# Patient Record
Sex: Female | Born: 1955 | Race: White | Hispanic: No | Smoking: Current every day smoker
Health system: Southern US, Community
[De-identification: ages and names within clinical notes are randomized; demographics above are authoritative.]

## PROBLEM LIST (undated history)

## (undated) HISTORY — PX: SHOULDER SURGERY: SHX246

---

## 1997-06-29 ENCOUNTER — Emergency Department (HOSPITAL_COMMUNITY): Admission: EM | Admit: 1997-06-29 | Discharge: 1997-06-29 | Payer: Self-pay | Admitting: *Deleted

## 1997-11-10 ENCOUNTER — Emergency Department (HOSPITAL_COMMUNITY): Admission: EM | Admit: 1997-11-10 | Discharge: 1997-11-10 | Payer: Self-pay | Admitting: Emergency Medicine

## 1999-06-20 ENCOUNTER — Emergency Department (HOSPITAL_COMMUNITY): Admission: EM | Admit: 1999-06-20 | Discharge: 1999-06-20 | Payer: Self-pay | Admitting: Emergency Medicine

## 1999-06-23 ENCOUNTER — Ambulatory Visit (HOSPITAL_COMMUNITY): Admission: RE | Admit: 1999-06-23 | Discharge: 1999-06-23 | Payer: Self-pay | Admitting: Emergency Medicine

## 1999-06-23 ENCOUNTER — Encounter: Payer: Self-pay | Admitting: Emergency Medicine

## 1999-08-10 ENCOUNTER — Emergency Department (HOSPITAL_COMMUNITY): Admission: EM | Admit: 1999-08-10 | Discharge: 1999-08-10 | Payer: Self-pay | Admitting: Emergency Medicine

## 1999-08-10 ENCOUNTER — Encounter: Payer: Self-pay | Admitting: Emergency Medicine

## 2000-05-06 ENCOUNTER — Encounter: Payer: Self-pay | Admitting: Orthopedic Surgery

## 2000-05-06 ENCOUNTER — Ambulatory Visit (HOSPITAL_COMMUNITY): Admission: RE | Admit: 2000-05-06 | Discharge: 2000-05-06 | Payer: Self-pay | Admitting: Orthopedic Surgery

## 2000-06-23 ENCOUNTER — Encounter: Payer: Self-pay | Admitting: Neurosurgery

## 2000-06-27 ENCOUNTER — Inpatient Hospital Stay (HOSPITAL_COMMUNITY): Admission: RE | Admit: 2000-06-27 | Discharge: 2000-06-28 | Payer: Self-pay | Admitting: Neurosurgery

## 2000-06-27 ENCOUNTER — Encounter: Payer: Self-pay | Admitting: Neurosurgery

## 2000-07-22 ENCOUNTER — Encounter: Payer: Self-pay | Admitting: Neurosurgery

## 2000-07-22 ENCOUNTER — Encounter: Admission: RE | Admit: 2000-07-22 | Discharge: 2000-07-22 | Payer: Self-pay | Admitting: Neurosurgery

## 2000-09-20 ENCOUNTER — Encounter: Payer: Self-pay | Admitting: Neurosurgery

## 2000-09-20 ENCOUNTER — Encounter: Admission: RE | Admit: 2000-09-20 | Discharge: 2000-09-20 | Payer: Self-pay | Admitting: Neurosurgery

## 2008-03-13 ENCOUNTER — Emergency Department (HOSPITAL_COMMUNITY): Admission: EM | Admit: 2008-03-13 | Discharge: 2008-03-14 | Payer: Self-pay | Admitting: Emergency Medicine

## 2010-08-07 NOTE — Consult Note (Signed)
[facility]  Patient:    Kimberly Holloway, Kimberly Holloway                      MRN: [id number] Proc. Date: 08/10/99 Attending:  Angelia Mould. Derrell Lolling, M.D. CC:         Gloriajean Dell. Andrey Campanile, M.D., [place] F.P.             Rema Fendt, P.A., [place] F.P.                          Consultation Report  CHIEF COMPLAINT:  Lower abdominal pain.  HISTORY OF PRESENTILLNESS:  This is a 55 year old white female from [place], [place].  She was sent to me by Rema Fendt, at [facility] for evaluation of lower abdominal pain.  This patient gives a six months history of intermittent attacks of periumbilical and infraumbilical crampy abdominal pain and possible distention.  There has been no change in her bowel movements.  She has solid bowel movements about every other dayand she has no alteration in her bowel habits during these attacks.  During the resolution phase of this pain, she will note the right lower quadrant pain would be noticeable.  These episodes of pain occur every 4-6 weeks and last about 24 hours and then with complete resolution and no symptoms in-between.  She induces vomiting occasionally. She notices diminished appetite during attacks.  States that it hurts to eat.  She was seen in the [facility] Emergency Room on June 20, 1999, for similar symptoms.  Lab work was completely normal.  Abdominal ultrasound and pelvic ultrasound were completely normal.  She had a typical attack which beganat 10 a.m. on Sunday, Aug 09, 1999.  She induced vomiting once to see ifthat would help, but it did not.  She had a normal bowel movement yesterday.  She has had no voiding symptoms.  There is a question of whether she has had some fever, but there has been shaking chills. She is evaluated in the [facility] Emergency Room.  PAST HISTORY: 1. Right shoulder reconstruction. 2. Bronchitis and asthma. 3. Tobacco abuse. 4. No history of gastrointestinal  disease.  CURRENT MEDICATIONS: 1.Goody Powders p.r.n. 2. Benadryl p.r.n.  DRUG ALLERGIES:  None known.  SOCIAL HISTORY:  The patient is married.  She has two children.  She works for [employer].  Her husband works as a Designer, fashion/clothing.  She smokes two packs of cigarettes per day.  Denies the use of alcohol.  FAMILY HISTORY:  Mother 14 and living.  Has hypertension, non-insulin-dependent diabetes, hypothyroidism, and COPD.  Father is 79 and living.  Has arthritis and a history of Graves disease and a history of peptic ulcerdisease.  She has four sisters, one with Graves disease and one with lung cancer and one with hypothyroidism.  One brother living and well.  REVIEW OF SYSTEMS:  All systems are reviewed and are negative, except as described above.  PHYSICAL EXAMINATION  GENERAL:  Reasonably healthy-appearing white female in minimal distress.  VITAL SIGNS:  Blood pressure 86/58, respiratory rate 16, temperature 98.8, heart rate 89.  SKIN:  Warm and dry.  HEENT:  Sclerae clear.  Extraocularmovements intact.  Oropharynx reveals upper dentures and poor dentition lower.  NECK:  Supple, nontender.  No mass.  No thyromegaly.  No adenopathy. No bruit.  LUNGS:  Clear to auscultation.  No CVA tenderness.  CARDIAC:  Heart regular rate and rhythm.  No murmur.  ABDOMEN:  Not distended.  The abdomen is soft.  Bowel sounds are present.  She is tender in the right lower quadrant but there is no mass.  There is mild guarding and mild rebound which is inconsistent.  There is no hernia.  RECTAL EXAM:  No mass.  No tenderness.  Hemoccult negative.  EXTREMITIES:  Free range of motion.  No deformity.  No edema.  Good pulses.  NEUROLOGIC:  Grossly within normal limits.  ADMISSION DATA:  Urinalysis clear.  White blood cell count 2800. Hemoglobin 14.7.  Amylase 40.  Potassium 2.8.  Glucose 155.  SGPT 41.  IMPRESSION: 1. Chronic intermittent lower abdominal pain, etiology unclear.    Chronic  recurring, so that appendicitis is possible, but felt to be    atypical; inflammatory bowel disease is possible.  Intermittent small-bowel    obstruction is possible.  Adnexal disease is possible but felt to be    unlikely considering her normal ultrasound on June 21, 1999. 2. Hypokalemia, unknownetiology. 3. Tobacco abuse.  PLAN:  The patient will have IV fluids started and potassium will be replaced. Will proceed with CT scan of the abdomen now for screening for inflammatory or obstructive process. DD:  08/10/99 TD:  08/10/99 Job: [id number]

## 2010-08-07 NOTE — H&P (Signed)
[place]. [facility]  Patient:    Kimberly Holloway, Kimberly Holloway                       MRN: [id number] Adm. Date:  [id number] Attending:  Barton Fanny                         History and Physical  HISTORY OF PRESENT ILLNESS:  The patient is a 55 year old right-handed white female who has been having difficulties for the past 2-1/2 months with numbness in her hands and digits and jumpiness in her lower extremities.  She had pain in the back of her neck for a couple months but it has been more severe for the past month.  She is unaware of any weakness and overall her symptoms are somewhat worse on the right than the left side.  She has been treated with NSAIDs without relief.  The patient was found on examination to have evidence of hyperreflexia and clonus, consistent with myelopathy.  Her MRI of the cervical spine showed degenerative disk disease with superimposed disk herniations, C5-6 worse than C6-7.  At the C5-6 level, there was significant stenosis of the spinal canal and increased signal from within the spinal cord and the patient is admitted now for surgical decompression.  PAST MEDICAL HISTORY:  She does not describe any history of hypertension, myocardial infarction, cancer, stroke, diabetes, peptic ulcer disease, or lung disease.  PAST SURGICAL HISTORY:  Her only previous surgery was four surgeries on her right shoulder.  ALLERGIES:  She denies allergies to medications.  CURRENT MEDICATIONS:  She currently is not taking any medications.  FAMILY HISTORY:  Her mother is in fair health at age 23.  She has diabetes. Her father is in good health at age 5.  SOCIAL HISTORY:  The patient is separated.  She has children ages 26 and 64. She smokes a pack a day.  She has been smoking for 30 years.  She does not drink alcoholic beverages.  She denies history of substance abuse.  REVIEW OF SYSTEMS:  Notable for those difficulties described in the history  of present illness and past medical history but is otherwise unremarkable.  PHYSICAL EXAMINATION:  GENERAL:  The patient is a well-developed, well-nourished female in no acute distress.  VITAL SIGNS:  Temperature 97.3, pulse 80, blood pressure 120/60, respiratory rate 16, height 5 feet 5-1/2 inches, weight 137 pounds.  LUNGS:  Clear to auscultation.  She has symmetrical respiratory excursion.  HEART:  Regular rate and rhythm.  Normal S1, S2.  There is no murmur.  ABDOMEN:  Soft, nondistended.  Bowel sounds are present.  EXTREMITIES:  No clubbing, cyanosis, or edema.  MUSCULOSKELETAL:  Fairly good range of motion in the neck without significant discomfort on range of motion of the neck.  There is no exquisite tenderness to palpation over the neck.  NEUROLOGIC:  Weakness in the right triceps at 4-/5.  The remainder of the upper extremity strength is intact including 5/5 strength to the deltoids and biceps bilaterally, the left triceps, as well as the intrinsics and grip bilaterally.  Sensation is intake to pinprick throughout the upper and lower extremities.  Reflexes at the left biceps and brachioradialis are 2-3, at the right biceps and brachioradialis are 1-2.  Triceps are 3 bilaterally.  The quadriceps are 3-4 bilaterally.  The gastrocnemii have nonsustained clonus bilaterally and are 4 bilaterally.  She has Hoffmanns bilaterally but toes  are downgoing bilaterally.  She has normal gait and stance.  IMPRESSION:  Neck pain and cervical radiculopathy with right triceps weakness, but most significantly she has cervical myelopathy with hyperreflexia, clonus, and increased signal from within the spinal cord due to degenerative disk disease, disk herniation, spondylosis of the spinal cord, compression with stenosis, worse at C5-6, and to a lesser extent at C6-7.  There are more mild degenerative changes seen at C4-5.  PLAN:  The patient will be admitted for a C5-6 to C6-7 anterior  cervical diskectomy and arthrodesis with allograft and cervical plating.  She understands that there are degenerative changes seen at the C4-5 level and that these may well progress as time goes on but that the most significant compression seen at this time is at the C5-6 and C6-7 levels and surgery at those levels will most likely result in successful relief of her symptoms.  We discussed the nature of surgery to include the surgery, hospital stay and overall recuperation and the need for postoperative immobilization and a soft cervical collar and risks of surgery including the infection, bleeding, possible need for transfusion, the risk of nervous dysfunction with pain, weakness, numbness or paresthesias, risk of spinal cord dysfunction, paralysis of all four limbs and quadriplegia, the risk of failure of the arthrodesis particularly in light of her heavy smoking history and anesthetic risk for myocardial infarction, stroke, pneumonia, and death; all of which again are increased due to her significant smoking history.  Understanding all this, the patient does wish to proceed with surgery and is admitted for such. DD:  06/27/00 TD:  06/27/00 Job: [id number]

## 2010-08-07 NOTE — Op Note (Signed)
[place]. [facility]  Patient:    Kimberly Holloway, Kimberly Holloway                       MRN: [id number] Proc. Date: 06/27/00 Adm. Date:  [id number] Attending:  Barton Fanny                           Operative Report  PREOPERATIVE DIAGNOSES:  Cervical degenerative disk disease, herniated nucleus pulposus, and spondylosis, with myelopathy.  POSTOPERATIVE DIAGNOSES:   Cervical degenerative disk disease, herniated nucleus pulposus, and spondylosis, with myelopathy.  PROCEDURES:  C5-6 and C6-7 anterior cervical diskectomy and arthrodesis with iliac crest allograft and Tether cervical plating.  SURGEON:  Hewitt Shorts, M.D.  ASSISTANT:  Tanya Nones. Jeral Fruit, M.D.  ANESTHESIA:  General endotracheal.  INDICATION:  The patient is a 55 year old woman who presented with neck pain, radiculopathy, right triceps weakness, and hyperreflexia and clonus consistent with myelopathy, who was found to have degenerative disk disease with superimposed disk herniations and spondylosis at the C5-6 and C6-7 levels. There is increased signal within the spinal cord at the C5-6 level.  Decision was made to proceed with two-level anterior cervical diskectomy and fusion.  DESCRIPTION OF PROCEDURE:  Patient brought to the operating room and placed under general endotracheal anesthesia.  The patient was placed in 10 pounds of Holter traction.  The neck was prepped with Betadine soap and solution and draped in a sterile fashion.  A horizontal incision was made in the left side of the neck.  The line of the incision was infiltrated with local anesthetic with epinephrine.  The incision itself was made with a Shaw scalpel at a temperature of 120.  The incision was carried down through the subcutaneous tissue and platysma.  Dissection was then carried out through an avascular plane, leaving the sternocleidomastoid, carotid artery, and jugular vein laterally, and the trachea and esophagus  medially.  The ventral aspect of the vertebral column was identified and a localizing x-ray taken and the C5-6 and C6-7 intervertebral disk spaces identified.  Diskectomy was begun at each level with incision of the annulus and continued with microcurettes and pituitary rongeurs.  The cartilaginous end plates at C5 and 6 and C6 and 7 were removed using microcurettes and the Micro-Max drill.  The microscope was draped and brought into the field to provide additional magnification, illumination, and visualization, and the remainder of the procedure was performed using microdissection.  Posterior osteophytic overgrowth at each level was removed using the Micro-Max drill and the 2 mm Kerrison punch with thin footplate.  The posterior longitudinal ligament was thickened and partially calcified, and this was carefully removed, as were additional disk fragments herniated within the ligament tissue.  Foraminotomy was performed with removal of hypertrophic spurring from the uncinate processes, and good decompression of the thecal sac and nerve roots was achieved bilaterally.  Once all the degenerated disk material and ligament tissue was removed and good decompression achieved, hemostasis was established with the use of Gelfoam soaked in thrombin.  We then selected wedges of iliac crest allograft that were cut and shaped and positioned in the intervertebral disk space and countersunk.  Once both grafts were in place, the traction was discontinued.  We then selected a 35 mm Tether cervical plate.  It was positioned over the fusion construct and secured to the C5 and C7 vertebrae with a pair of 13 mm screws, and a single  13 mm screw was placed at C6.  Each of the screw holes was started with an awl and then the screws placed, and final tightening was done once all five screws were in place.  An x-ray was taken to confirm the fusion constructs alignment.  Good position was noted in the grafts and of  the plate and screws, and the wound was irrigated extensively with bacitracin solution, checked for hemostasis, which was established and confirmed, and then we proceeded with closure.  The platysma was closed with interrupted inverted 2-0 undyed Vicryl sutures, the subcutaneous and subcuticular were closed with interrupted inverted 3-0 undyed Vicryl sutures, and the skin edges were approximated with Dermabond.  The patient tolerated the procedure well.  The estimated blood loss was 150 cc. Sponge and needle count were correct.  Following surgery, the patient was reversed from the anesthetic, placed in a soft cervical collar, extubated, and transferred to the recovery room for further care. DD:  06/27/00 TD:  06/27/00 Job: [id number]

## 2010-12-24 LAB — POCT I-STAT, CHEM 8
Chloride: 103 mEq/L (ref 96–112)
Creatinine, Ser: 0.7 mg/dL (ref 0.4–1.2)
Glucose, Bld: 114 mg/dL — ABNORMAL HIGH (ref 70–99)
Potassium: 3.6 mEq/L (ref 3.5–5.1)

## 2010-12-24 LAB — URINALYSIS, ROUTINE W REFLEX MICROSCOPIC
Bilirubin Urine: NEGATIVE
Hgb urine dipstick: NEGATIVE
Nitrite: NEGATIVE
Specific Gravity, Urine: 1.015 (ref 1.005–1.030)
pH: 8.5 — ABNORMAL HIGH (ref 5.0–8.0)

## 2010-12-24 LAB — DIFFERENTIAL
Basophils Absolute: 0 10*3/uL (ref 0.0–0.1)
Lymphocytes Relative: 22 % (ref 12–46)
Neutro Abs: 4.4 10*3/uL (ref 1.7–7.7)
Neutrophils Relative %: 73 % (ref 43–77)

## 2010-12-24 LAB — CBC
Hemoglobin: 16.5 g/dL — ABNORMAL HIGH (ref 12.0–15.0)
Platelets: 189 10*3/uL (ref 150–400)
RDW: 13.3 % (ref 11.5–15.5)

## 2010-12-24 LAB — HEPATIC FUNCTION PANEL
ALT: 12 U/L (ref 0–35)
AST: 29 U/L (ref 0–37)
Albumin: 4.3 g/dL (ref 3.5–5.2)
Total Protein: 7.9 g/dL (ref 6.0–8.3)

## 2011-11-08 ENCOUNTER — Emergency Department (HOSPITAL_COMMUNITY)
Admission: EM | Admit: 2011-11-08 | Discharge: 2011-11-09 | Disposition: A | Discharge status: Home / Self Care | Payer: Self-pay | Attending: Emergency Medicine | Admitting: Emergency Medicine

## 2011-11-08 ENCOUNTER — Emergency Department (HOSPITAL_COMMUNITY): Payer: Self-pay

## 2011-11-08 ENCOUNTER — Encounter (HOSPITAL_COMMUNITY): Payer: Self-pay | Admitting: *Deleted

## 2011-11-08 DIAGNOSIS — M25512 Pain in left shoulder: Secondary | ICD-10-CM

## 2011-11-08 DIAGNOSIS — F17200 Nicotine dependence, unspecified, uncomplicated: Secondary | ICD-10-CM | POA: Insufficient documentation

## 2011-11-08 DIAGNOSIS — M25519 Pain in unspecified shoulder: Secondary | ICD-10-CM | POA: Insufficient documentation

## 2011-11-08 NOTE — ED Notes (Signed)
Pt c/o left shoulder/arm pain; hurts to raise arm to shoulder level; no known injury

## 2011-11-09 MED ORDER — HYDROCODONE-ACETAMINOPHEN 5-325 MG PO TABS: 2.0000 | ORAL_TABLET | Freq: Four times a day (QID) | ORAL | Status: AC | PRN

## 2011-11-09 NOTE — ED Provider Notes (Signed)
History     CSN: [id number]  Arrival date & time 11/08/11  2120   First MD Initiated Contact with Patient 11/09/11 0017      Chief Complaint  Patient presents with  . Arm Pain    (Consider location/radiation/quality/duration/timing/severity/associated sxs/prior treatment) HPI Comments: Patient presents today with a chief complaint of left shoulder pain.  No known injury.  Decreased ROM and pain with abduction of the shoulder.  Patient is a 56 y.o. female presenting with shoulder pain. The history is provided by the patient.  Shoulder Pain This is a new problem. Episode onset: three weeks. The problem occurs constantly. The problem has been gradually worsening. Exacerbated by: abduction. She has tried NSAIDs for the symptoms. The treatment provided mild relief.    History reviewed. No pertinent past medical history.  Past Surgical History  Procedure Date  . Shoulder surgery     No family history on file.  History  Substance Use Topics  . Smoking status: Current Everyday Smoker -- 1.0 packs/day  . Smokeless tobacco: Not on file  . Alcohol Use: No    OB History    Grav Para Term Preterm Abortions TAB SAB Ect Mult Living                  Review of Systems  Musculoskeletal:       Left shoulder pain  Skin: Negative for color change.    Allergies  Morphine and related  Home Medications   Current Outpatient Rx  Name Route Sig Dispense Refill  . GOODY HEADACHE PO Oral Take 1 packet by mouth daily as needed. Arm pain.      BP 124/74  Pulse 88  Temp 98 F (36.7 C) (Oral)  Wt 137 lb (62.143 kg)  SpO2 99%  Physical Exam  Nursing note and vitals reviewed. Constitutional: She appears well-developed and well-nourished. No distress.  HENT:  Head: Normocephalic and atraumatic.  Cardiovascular: Normal rate, regular rhythm and normal heart sounds.   Pulmonary/Chest: Effort normal and breath sounds normal.  Musculoskeletal:       Left shoulder: She exhibits  decreased range of motion. She exhibits no tenderness, no bony tenderness, no swelling, no deformity and normal pulse.       Decreased ROM and pain with abduction of the left shoulder  Neurological: She is alert.  Skin: Skin is warm and dry. She is not diaphoretic. No erythema.  Psychiatric: She has a normal mood and affect.    ED Course  Procedures (including critical care time)  Labs Reviewed - No data to display Dg Shoulder Left  11/08/2011  *RADIOLOGY REPORT*  Clinical Data: Left shoulder pain for 3 weeks.  No injury.  LEFT SHOULDER - 2+ VIEW  Comparison: None.  Findings: Postoperative changes in the cervical spine.  The left shoulder appears intact. No evidence of acute fracture or subluxation.  No focal bone lesions.  Bone matrix and cortex appear intact.  No abnormal radiopaque densities in the soft tissues. Focal irregularity in the anterior left second rib may represent old fracture deformity.  Coracoclavicular and acromioclavicular spaces appear intact.  IMPRESSION: No acute bony abnormalities appreciated.   Original Report Authenticated By: Marlon Pel, M.D.      No diagnosis found.    MDM  Patient presenting with left shoulder pain.  Decreased abduction of left shoulder.  No acute injury.  Suspect rotator cuff.  Patient given short course of pain medications and referral to Orthopedics.  Pascal Lux Saxton, PA-C 11/10/11 2239

## 2011-11-11 NOTE — ED Provider Notes (Signed)
Medical screening examination/treatment/procedure(s) were performed by non-physician practitioner and as supervising physician I was immediately available for consultation/collaboration.  Eddison Searls M Jiah Bari, MD 11/11/11 1628 

## 2014-06-26 ENCOUNTER — Emergency Department (HOSPITAL_COMMUNITY)
Admission: EM | Admit: 2014-06-26 | Discharge: 2014-06-27 | Disposition: A | Discharge status: Home / Self Care | Payer: Worker's Compensation | Attending: Emergency Medicine | Admitting: Emergency Medicine

## 2014-06-26 ENCOUNTER — Encounter (HOSPITAL_COMMUNITY): Payer: Self-pay | Admitting: *Deleted

## 2014-06-26 DIAGNOSIS — S50852A Superficial foreign body of left forearm, initial encounter: Secondary | ICD-10-CM | POA: Insufficient documentation

## 2014-06-26 DIAGNOSIS — Y9389 Activity, other specified: Secondary | ICD-10-CM | POA: Diagnosis not present

## 2014-06-26 DIAGNOSIS — Z720 Tobacco use: Secondary | ICD-10-CM | POA: Insufficient documentation

## 2014-06-26 DIAGNOSIS — Y9289 Other specified places as the place of occurrence of the external cause: Secondary | ICD-10-CM | POA: Diagnosis not present

## 2014-06-26 DIAGNOSIS — Y990 Civilian activity done for income or pay: Secondary | ICD-10-CM | POA: Insufficient documentation

## 2014-06-26 DIAGNOSIS — Y288XXA Contact with other sharp object, undetermined intent, initial encounter: Secondary | ICD-10-CM | POA: Diagnosis not present

## 2014-06-26 NOTE — ED Notes (Signed)
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  No pain

## 2014-06-27 ENCOUNTER — Emergency Department (HOSPITAL_COMMUNITY): Payer: Worker's Compensation

## 2014-06-27 MED ORDER — LIDOCAINE HCL 2 % IJ SOLN
5.0000 mL | Freq: Once | INTRAMUSCULAR | Status: AC
  Administered 2014-06-27: 100 mg
  Filled 2014-06-27: qty 20

## 2014-06-27 MED ORDER — CEPHALEXIN 500 MG PO CAPS: 500.0000 mg | ORAL_CAPSULE | Freq: Four times a day (QID) | ORAL | Status: DC

## 2014-06-27 MED ORDER — CEPHALEXIN 250 MG PO CAPS
500.0000 mg | ORAL_CAPSULE | Freq: Once | ORAL | Status: AC
  Administered 2014-06-27: 500 mg via ORAL
  Filled 2014-06-27: qty 2

## 2014-06-27 NOTE — ED Notes (Signed)
MD Glick at bedside. 

## 2014-06-27 NOTE — ED Notes (Signed)
MD Preston FleetingGlick at bedside,

## 2014-06-27 NOTE — ED Provider Notes (Signed)
CSN: [id number]     Arrival date & time 06/26/14  2328 History   First MD Initiated Contact with Patient 06/27/14 0108     This chart was scribed for Kimberly Booze, MD by Arlan Organ, ED Scribe. This patient was seen in room A01C/A01C and the patient's care was started 1:16 AM.   Chief Complaint  Patient presents with  . Foreign Body in Skin   The history is provided by the patient. No language interpreter was used.    HPI Comments: Kimberly Holloway is a 59 y.o. female who presents to the Emergency Department here after accidentally lodging a knitting hook in her L forearm while at work this evening. She denies any pain at this time. Ms. Carrara denies any attempts to remove the object herself prior to arrival. Pt unaware of Tetanus status. Pt with known allergies to Morphine.  History reviewed. No pertinent past medical history. Past Surgical History  Procedure Laterality Date  . Shoulder surgery     No family history on file. History  Substance Use Topics  . Smoking status: Current Every Day Smoker -- 1.00 packs/day  . Smokeless tobacco: Not on file  . Alcohol Use: Yes   OB History    No data available     Review of Systems  Musculoskeletal: Negative for arthralgias.  Skin: Positive for wound.  All other systems reviewed and are negative.     Allergies  Morphine and related  Home Medications   Prior to Admission medications   Medication Sig Start Date End Date Taking? Authorizing Provider  Aspirin-Acetaminophen-Caffeine (GOODY HEADACHE PO) Take 1 packet by mouth daily as needed. Arm pain.    Historical Provider, MD   Triage Vitals: BP 132/78 mmHg  Pulse 92  Temp(Src) 98.3 F (36.8 C) (Oral)  Resp 16  SpO2 97%   Physical Exam  Constitutional: She is oriented to person, place, and time. She appears well-developed and well-nourished. No distress.  HENT:  Head: Normocephalic and atraumatic.  Eyes: EOM are normal. Pupils are equal, round, and reactive to light.   Neck: Normal range of motion. Neck supple. No JVD present.  Cardiovascular: Normal rate, regular rhythm and normal heart sounds.   No murmur heard. Pulmonary/Chest: Effort normal and breath sounds normal. She has no wheezes. She has no rales. She exhibits no tenderness.  Abdominal: Soft. Bowel sounds are normal. She exhibits no distension and no mass. There is no tenderness.  Musculoskeletal: Normal range of motion. She exhibits no edema.  Lymphadenopathy:    She has no cervical adenopathy.  Neurological: She is alert and oriented to person, place, and time. No cranial nerve deficit. Coordination normal.  Skin: Skin is warm and dry. No rash noted.  Foreign body protruding from L forearm  Psychiatric: She has a normal mood and affect. Judgment and thought content normal.  Nursing note and vitals reviewed.   ED Course  FOREIGN BODY REMOVAL Date/Time: 06/27/2014 2:00 AM Performed by: Kimberly Holloway Authorized by: Preston Fleeting, Anasia Agro Consent: Verbal consent obtained. Written consent not obtained. Risks and benefits: risks, benefits and alternatives were discussed Consent given by: patient Patient understanding: patient states understanding of the procedure being performed Patient consent: the patient's understanding of the procedure matches consent given Procedure consent: procedure consent matches procedure scheduled Relevant documents: relevant documents present and verified Test results: test results available and properly labeled Site marked: the operative site was marked Imaging studies: imaging studies available Required items: required blood products, implants, devices, and special equipment  available Patient identity confirmed: verbally with patient and arm band Time out: Immediately prior to procedure a "time out" was called to verify the correct patient, procedure, equipment, support staff and site/side marked as required. Body area: skin General location: upper extremity Location  details: left forearm Anesthesia: local infiltration Local anesthetic: lidocaine 2% without epinephrine Anesthetic total: 2 ml Patient sedated: no Patient restrained: no Patient cooperative: yes Localization method: visualized Removal mechanism: manipulation, small skin incision with #11 scalpel. Dressing: dressing applied Tendon involvement: none Depth: subcutaneous Complexity: simple 1 objects recovered. Objects recovered: hook Post-procedure assessment: foreign body removed Patient tolerance: Patient tolerated the procedure well with no immediate complications   (including critical care time)  DIAGNOSTIC STUDIES: Oxygen Saturation is 97% on RA, Normal by my interpretation.    COORDINATION OF CARE: 1:09 AM-Discussed treatment plan with pt at bedside and pt agreed to plan.     Imaging Review Dg Forearm Left  06/27/2014   CLINICAL DATA:  Foreign body in the scan. Metallic pin/hook punctured through the left medial forearm.  EXAM: LEFT FOREARM - 2 VIEW  COMPARISON:  None.  FINDINGS: A metallic instrument is demonstrated projected over the mid to proximal left forearm along the dorsal surface superficial to the bone. No bone involvement is demonstrated. No evidence of acute fracture or dislocation. No significant soft tissue gas collection.  IMPRESSION: Metallic foreign body demonstrated in the soft tissues over the dorsal aspect of the proximal left forearm. No acute bony abnormalities.   Electronically Signed   By: Burman NievesWilliam  Stevens M.D.   On: 06/27/2014 00:08   Images viewed by me.   MDM   Final diagnoses:  Foreign body in left forearm, initial encounter    Foreign body in left forearm. X-rays confirm metallic foreign body. It appears to be in subcutaneous tissue. After local anesthesia, it was removed with gentle traction. Hook got caught near the skin and a small incision was made in the skin and the bevel of an 18-gauge needle was placed over the& metallic body was removed in  its entirety.  TDaP booster is given. She is discharged with prescription for cephalexin for 3 days.  I personally performed the services described in this documentation, which was scribed in my presence. The recorded information has been reviewed and is accurate.      Kimberly Boozeavid Zaylie Gisler, MD 06/27/14 [phone]

## 2014-06-27 NOTE — Discharge Instructions (Signed)
Take ibuprofen or acetaminophen as needed for pain.  Watch for signs of infection.  Td Vaccine (Tetanus and Diphtheria): What You Need to Know 1. Why get vaccinated? Tetanus  and diphtheria are very serious diseases. They are rare in the [place] today, but people who do become infected often have severe complications. Td vaccine is used to protect adolescents and adults from both of these diseases. Both tetanus and diphtheria are infections caused by bacteria. Diphtheria spreads from person to person through coughing or sneezing. Tetanus-causing bacteria enter the body through cuts, scratches, or wounds. TETANUS (Lockjaw) causes painful muscle tightening and stiffness, usually all over the body.  It can lead to tightening of muscles in the head and neck so you can't open your mouth, swallow, or sometimes even breathe. Tetanus kills about 1 out of every 5 people who are infected. DIPHTHERIA can cause a thick coating to form in the back of the throat.  It can lead to breathing problems, paralysis, heart failure, and death. Before vaccines, the [place] saw as many as 200,000 cases a year of diphtheria and hundreds of cases of tetanus. Since vaccination began, cases of both diseases have dropped by about 99%. 2. Td vaccine Td vaccine can protect adolescents and adults from tetanus and diphtheria. Td is usually given as a booster dose every 10 years but it can also be given earlier after a severe and dirty wound or burn. Your doctor can give you more information. Td may safely be given at the same time as other vaccines. 3. Some people should not get this vaccine  If you ever had a life-threatening allergic reaction after a dose of any tetanus or diphtheria containing vaccine, OR if you have a severe allergy to any part of this vaccine, you should not get Td. Tell your doctor if you have any severe allergies.  Talk to your doctor if you:  have epilepsy or another nervous system  problem,  had severe pain or swelling after any vaccine containing diphtheria or tetanus,  ever had Guillain Barr Syndrome (GBS),  aren't feeling well on the day the shot is scheduled. 4. Risks of a vaccine reaction With a vaccine, like any medicine, there is a chance of side effects. These are usually mild and go away on their own. Serious side effects are also possible, but are very rare. Most people who get Td vaccine do not have any problems with it. Mild Problems  following Td (Did not interfere with activities)  Pain where the shot was given (about 8 people in 10)  Redness or swelling where the shot was given (about 1 person in 3)  Mild fever (about 1 person in 15)  Headache or Tiredness (uncommon) Moderate Problems following Td (Interfered with activities, but did not require medical attention)  Fever over 102F (rare) Severe Problems  following Td (Unable to perform usual activities; required medical attention)  Swelling, severe pain, bleeding and/or redness in the arm where the shot was given (rare). Problems that could happen after any vaccine:  Brief fainting spells can happen after any medical procedure, including vaccination. Sitting or lying down for about 15 minutes can help prevent fainting, and injuries caused by a fall. Tell your doctor if you feel dizzy, or have vision changes or ringing in the ears.  Severe shoulder pain and reduced range of motion in the arm where a shot was given can happen, very rarely, after a vaccination.  Severe allergic reactions from a vaccine are very  rare, estimated at less than 1 in a million doses. If one were to occur, it would usually be within a few minutes to a few hours after the vaccination. 5. What if there is a serious reaction? What should I look for?  Look for anything that concerns you, such as signs of a severe allergic reaction, very high fever, or behavior changes. Signs of a severe allergic reaction can include  hives, swelling of the face and throat, difficulty breathing, a fast heartbeat, dizziness, and weakness. These would usually start a few minutes to a few hours after the vaccination. What should I do?  If you think it is a severe allergic reaction or other emergency that can't wait, call 9-1-1 or get the person to the nearest hospital. Otherwise, call your doctor.  Afterward, the reaction should be reported to the Vaccine Adverse Event Reporting System (VAERS). Your doctor might file this report, or you can do it yourself through the VAERS web site at www.vaers.LAgents.nohhs.gov, or by calling 1-226-793-2897. VAERS is only for reporting reactions. They do not give medical advice. 6. The National Vaccine Injury Compensation Program The [employer] Vaccine Injury Compensation Program (VICP) is a federal program that was created to compensate people who may have been injured by certain vaccines. Persons who believe they may have been injured by a vaccine can learn about the program and about filing a claim by calling 1-681-491-1012 or visiting the VICP website at SpiritualWord.atwww.hrsa.gov/vaccinecompensation. 7. How can I learn more?  Ask your doctor.  Contact your local or state health department.  Contact the Centers for Disease Control and Prevention (CDC):  Call (984)102-84511-856 746 6011 (1-800-CDC-INFO)  Visit CDC's website at PicCapture.uywww.cdc.gov/vaccines CDC Td Vaccine Interim VIS (04/25/12) Document Released: 01/03/2006 Document Revised: 07/23/2013 Document Reviewed: 06/20/2013 [facility] Patient Information 2015 [employer] [place]. This information is not intended to replace advice given to you by your health care provider. Make sure you discuss any questions you have with your health care provider.

## 2014-06-27 NOTE — ED Notes (Signed)
Suture cart placed at bedside. 

## 2014-06-27 NOTE — ED Notes (Signed)
Pt transported to xray 

## 2014-06-27 NOTE — ED Notes (Signed)
Lidocaine placed at bedside.

## 2016-01-20 ENCOUNTER — Encounter (HOSPITAL_COMMUNITY): Payer: Self-pay | Admitting: *Deleted

## 2016-01-20 ENCOUNTER — Ambulatory Visit (INDEPENDENT_AMBULATORY_CARE_PROVIDER_SITE_OTHER): Payer: BLUE CROSS/BLUE SHIELD

## 2016-01-20 ENCOUNTER — Ambulatory Visit (HOSPITAL_COMMUNITY)
Admission: EM | Admit: 2016-01-20 | Discharge: 2016-01-20 | Disposition: A | Discharge status: Home / Self Care | Payer: BLUE CROSS/BLUE SHIELD | Attending: Family Medicine | Admitting: Family Medicine

## 2016-01-20 DIAGNOSIS — R52 Pain, unspecified: Secondary | ICD-10-CM

## 2016-01-20 DIAGNOSIS — M25552 Pain in left hip: Secondary | ICD-10-CM

## 2016-01-20 MED ORDER — MELOXICAM 15 MG PO TABS: 15.0000 mg | ORAL_TABLET | Freq: Every day | ORAL | 1 refills | Status: DC

## 2016-01-20 NOTE — ED Provider Notes (Signed)
[facility]    CSN: [id number] Arrival date & time: 01/20/16  [id number]     History   Chief Complaint Chief Complaint  Patient presents with  . Hip Pain    HPI Kimberly Holloway is a 60 y.o. female.   The history is provided by the patient.  Hip Pain  This is a chronic problem. The current episode started more than 1 week ago. The problem has been gradually worsening. Pertinent negatives include no chest pain and no abdominal pain. The symptoms are aggravated by walking.    History reviewed. No pertinent past medical history.  There are no active problems to display for this patient.   Past Surgical History:  Procedure Laterality Date  . SHOULDER SURGERY      OB History    No data available       Home Medications    Prior to Admission medications   Medication Sig Start Date End Date Taking? Authorizing Provider  Aspirin-Acetaminophen-Caffeine (GOODY HEADACHE PO) Take 1 packet by mouth daily as needed. Arm pain.    Historical Provider, MD  cephALEXin (KEFLEX) 500 MG capsule Take 1 capsule (500 mg total) by mouth 4 (four) times daily. 06/27/14   Dione Boozeavid Glick, MD    Family History No family history on file.  Social History Social History  Substance Use Topics  . Smoking status: Current Every Day Smoker    Packs/day: 1.00  . Smokeless tobacco: Not on file  . Alcohol use Yes     Allergies   Morphine and related   Review of Systems Review of Systems  Constitutional: Negative.   Cardiovascular: Negative for chest pain.  Gastrointestinal: Negative.  Negative for abdominal pain.  Genitourinary: Negative.   Musculoskeletal: Positive for gait problem. Negative for back pain and neck pain.  All other systems reviewed and are negative.    Physical Exam Triage Vital Signs ED Triage Vitals [01/20/16 1012]  Enc Vitals Group     BP 139/83     Pulse Rate 96     Resp 18     Temp 97.5 F (36.4 C)     Temp src      SpO2 97 %     Weight      Height        Head Circumference      Peak Flow      Pain Score      Pain Loc      Pain Edu?      Excl. in [place]?    No data found.   Updated Vital Signs BP 139/83 (BP Location: Left Arm)   Pulse 96   Temp 97.5 F (36.4 C)   Resp 18   SpO2 97%   Visual Acuity Right Eye Distance:   Left Eye Distance:   Bilateral Distance:    Right Eye Near:   Left Eye Near:    Bilateral Near:     Physical Exam  Constitutional: She is oriented to person, place, and time. She appears well-developed and well-nourished. No distress.  Abdominal: Soft. Bowel sounds are normal.  Musculoskeletal: She exhibits tenderness.       Left hip: She exhibits decreased range of motion, tenderness and bony tenderness. She exhibits no swelling and no deformity.  Neurological: She is alert and oriented to person, place, and time.  Nursing note and vitals reviewed.    UC Treatments / Results  Labs (all labs ordered are listed, but only abnormal results are displayed) Labs  Reviewed - No data to display  EKG  EKG Interpretation None       Radiology No results found. X-rays reviewed and report per radiologist.  Procedures Procedures (including critical care time)  Medications Ordered in UC Medications - No data to display   Initial Impression / Assessment and Plan / UC Course  I have reviewed the triage vital signs and the nursing notes.  Pertinent labs & imaging results that were available during my care of the patient were reviewed by me and considered in my medical decision making (see chart for details).  Clinical Course      Final Clinical Impressions(s) / UC Diagnoses   Final diagnoses:  Pain    New Prescriptions New Prescriptions   No medications on file     Linna HoffJames D Areta Terwilliger, MD 01/20/16 1231

## 2016-01-20 NOTE — ED Notes (Signed)
Pt   Notified   Of   The  Delay  In   X  Ray

## 2016-01-20 NOTE — ED Triage Notes (Signed)
Pt  Reports   Chronic    Hip  That  Flares  Up  occasonally      l  Hip   Pain is  Worse  When  She  Ambulates    denys  Any  Recent  injury

## 2019-08-28 ENCOUNTER — Emergency Department
Admission: EM | Admit: 2019-08-28 | Discharge: 2019-08-28 | Disposition: A | Discharge status: Home / Self Care | Payer: Worker's Compensation | Attending: Student in an Organized Health Care Education/Training Program | Admitting: Student in an Organized Health Care Education/Training Program

## 2019-08-28 ENCOUNTER — Encounter: Payer: Self-pay | Admitting: Emergency Medicine

## 2019-08-28 ENCOUNTER — Emergency Department: Payer: Worker's Compensation

## 2019-08-28 ENCOUNTER — Other Ambulatory Visit: Payer: Self-pay

## 2019-08-28 DIAGNOSIS — Y999 Unspecified external cause status: Secondary | ICD-10-CM | POA: Insufficient documentation

## 2019-08-28 DIAGNOSIS — Y929 Unspecified place or not applicable: Secondary | ICD-10-CM | POA: Insufficient documentation

## 2019-08-28 DIAGNOSIS — S6991XA Unspecified injury of right wrist, hand and finger(s), initial encounter: Secondary | ICD-10-CM | POA: Diagnosis present

## 2019-08-28 DIAGNOSIS — S62101A Fracture of unspecified carpal bone, right wrist, initial encounter for closed fracture: Secondary | ICD-10-CM

## 2019-08-28 DIAGNOSIS — W1839XA Other fall on same level, initial encounter: Secondary | ICD-10-CM | POA: Diagnosis not present

## 2019-08-28 DIAGNOSIS — S52501A Unspecified fracture of the lower end of right radius, initial encounter for closed fracture: Secondary | ICD-10-CM | POA: Insufficient documentation

## 2019-08-28 DIAGNOSIS — Y939 Activity, unspecified: Secondary | ICD-10-CM | POA: Insufficient documentation

## 2019-08-28 DIAGNOSIS — F17210 Nicotine dependence, cigarettes, uncomplicated: Secondary | ICD-10-CM | POA: Diagnosis not present

## 2019-08-28 MED ORDER — IBUPROFEN 600 MG PO TABS
600.0000 mg | ORAL_TABLET | Freq: Once | ORAL | Status: AC
Start: 2019-08-28 — End: 2019-08-28
  Administered 2019-08-28: 600 mg via ORAL
  Filled 2019-08-28: qty 1

## 2019-08-28 MED ORDER — TRAMADOL HCL 50 MG PO TABS
50.0000 mg | ORAL_TABLET | Freq: Once | ORAL | Status: AC
  Administered 2019-08-28: 50 mg via ORAL
  Filled 2019-08-28: qty 1

## 2019-08-28 MED ORDER — IBUPROFEN 600 MG PO TABS: 600.0000 mg | ORAL_TABLET | Freq: Three times a day (TID) | ORAL | 0 refills | Status: AC | PRN

## 2019-08-28 MED ORDER — TRAMADOL HCL 50 MG PO TABS: 50.0000 mg | ORAL_TABLET | Freq: Four times a day (QID) | ORAL | 0 refills | Status: AC | PRN

## 2019-08-28 NOTE — ED Triage Notes (Signed)
Patient ambulatory to triage with steady gait, without difficulty or distress noted; pt reports PTA while at work, a roll of cloth fell off hitting her rt FA arm (pt employed with [employer]--workers comp profile indicates UDS required)

## 2019-08-28 NOTE — ED Notes (Signed)
See triage note  States she hurt her arm while at work this am   States a roll of cloth rolled on to right forearm  Good pulses

## 2019-08-28 NOTE — ED Provider Notes (Signed)
[facility] Emergency Department Provider Note   ____________________________________________   None    (approximate)  I have reviewed the triage vital signs and the nursing notes.   HISTORY  Chief Complaint Arm Injury    HPI Kimberly Holloway is a 64 y.o. female patient complain of right wrist pain secondary to fall.  Patient stated a roller claw fell on her causing her to fall injuring her right wrist.  Patient denies loss sensation or loss of function.  Patient rates pain a 7/10.  Patient scribed pain is "achy".  Patient is right-hand dominant.  No palliative measure prior to arrival.         History reviewed. No pertinent past medical history.  There are no problems to display for this patient.   Past Surgical History:  Procedure Laterality Date  . SHOULDER SURGERY      Prior to Admission medications   Medication Sig Start Date End Date Taking? Authorizing Provider  Aspirin-Acetaminophen-Caffeine (GOODY HEADACHE PO) Take 1 packet by mouth daily as needed. Arm pain.    [provider]  ibuprofen (ADVIL) 600 MG tablet Take 1 tablet (600 mg total) by mouth every 8 (eight) hours as needed. 08/28/19   Sable Feil, PA-C  traMADol (ULTRAM) 50 MG tablet Take 1 tablet (50 mg total) by mouth every 6 (six) hours as needed. 08/28/19 08/27/20  Sable Feil, PA-C    Allergies Morphine and related  No family history on file.  Social History Social History   Tobacco Use  . Smoking status: Current Every Day Smoker    Packs/day: 1.00  . Smokeless tobacco: Never Used  Substance Use Topics  . Alcohol use: Yes  . Drug use: Not on file    Review of Systems Constitutional: No fever/chills Eyes: No visual changes. ENT: No sore throat. Cardiovascular: Denies chest pain. Respiratory: Denies shortness of breath. Gastrointestinal: No abdominal pain.  No nausea, no vomiting.  No diarrhea.  No constipation. Genitourinary: Negative for  dysuria. Musculoskeletal: Right wrist pain. Skin: Negative for rash. Neurological: Negative for headaches, focal weakness or numbness. Allergic/Immunilogical: Morphine. ____________________________________________   PHYSICAL EXAM:  VITAL SIGNS: ED Triage Vitals  Enc Vitals Group     BP 08/28/19 0149 113/63     Pulse Rate 08/28/19 0149 87     Resp 08/28/19 0149 18     Temp 08/28/19 0149 98.6 F (37 C)     Temp Source 08/28/19 0149 Oral     SpO2 08/28/19 0149 96 %     Weight 08/28/19 0135 117 lb (53.1 kg)     Height 08/28/19 0135 5\' 5"  (1.651 m)     Head Circumference --      Peak Flow --      Pain Score 08/28/19 0135 7     Pain Loc --      Pain Edu? --      Excl. in [place]? --    Constitutional: Alert and oriented. Well appearing and in no acute distress. Cardiovascular: Normal rate, regular rhythm. Grossly normal heart sounds.  Good peripheral circulation. Respiratory: Normal respiratory effort.  No retractions. Lungs CTAB. Musculoskeletal: No obvious deformity to the right wrist.  Patient has moderate guarding palpation distal radius.   Skin:  Skin is warm, dry and intact. No rash noted. Psychiatric: Mood and affect are normal. Speech and behavior are normal.  ____________________________________________   LABS (all labs ordered are listed, but only abnormal results are displayed)  Labs Reviewed -  No data to display ____________________________________________  EKG   ____________________________________________  RADIOLOGY  ED MD interpretation:    Official radiology report(s): DG Forearm Right  Result Date: 08/28/2019 CLINICAL DATA:  Right arm pain.  Work related injury. EXAM: RIGHT FOREARM - 2 VIEW COMPARISON:  None. FINDINGS: There is a fracture of the distal right radius with minimal dorsal displacement. Minimal soft tissue swelling. No dislocation. IMPRESSION: Minimally displaced fracture of the distal right radius. Electronically Signed   By: Deatra Robinson  M.D.   On: 08/28/2019 02:18    ____________________________________________   PROCEDURES  Procedure(s) performed (including Critical Care):  Procedures   ____________________________________________   INITIAL IMPRESSION / ASSESSMENT AND PLAN / ED COURSE  As part of my medical decision making, I reviewed the following data within the electronic MEDICAL RECORD NUMBER     Right wrist pain secondary to fall.  Discussed x-ray findings with patient showing a distal right radial fracture with minimal displacement.  Patient placed in a splint and sling.  Patient given discharge care instruction.  Patient states she will follow up with orthopedic of her choice in [facility].    Kimberly Holloway was evaluated in Emergency Department on 08/28/2019 for the symptoms described in the history of present illness. She was evaluated in the context of the global COVID-19 pandemic, which necessitated consideration that the patient might be at risk for infection with the SARS-CoV-2 virus that causes COVID-19. Institutional protocols and algorithms that pertain to the evaluation of patients at risk for COVID-19 are in a state of rapid change based on information released by regulatory bodies including the CDC and federal and state organizations. These policies and algorithms were followed during the patient's care in the ED.       ____________________________________________   FINAL CLINICAL IMPRESSION(S) / ED DIAGNOSES  Final diagnoses:  Wrist fracture, closed, right, initial encounter     ED Discharge Orders         Ordered    traMADol (ULTRAM) 50 MG tablet  Every 6 hours PRN     08/28/19 0718    ibuprofen (ADVIL) 600 MG tablet  Every 8 hours PRN     08/28/19 0718           Note:  This document was prepared using Dragon voice recognition software and may include unintentional dictation errors.    Joni Reining, PA-C 08/28/19 3291    Willy Eddy, MD 08/28/19  [phone]

## 2019-08-28 NOTE — Discharge Instructions (Signed)
Wear wrist splint and sling until evaluation by orthopedic of your choice.

## 2021-01-24 IMAGING — CR DG FOREARM 2V*R*
1 series · 2 of 2 positions shown · non-contrast
Comparison: None.

CLINICAL DATA: Right arm pain.  Work related injury.

EXAM:
RIGHT FOREARM - 2 VIEW

[Series 1: dg forearm right · 0.14mm/px · 2 of 2 slices shown]
[im 1/2]
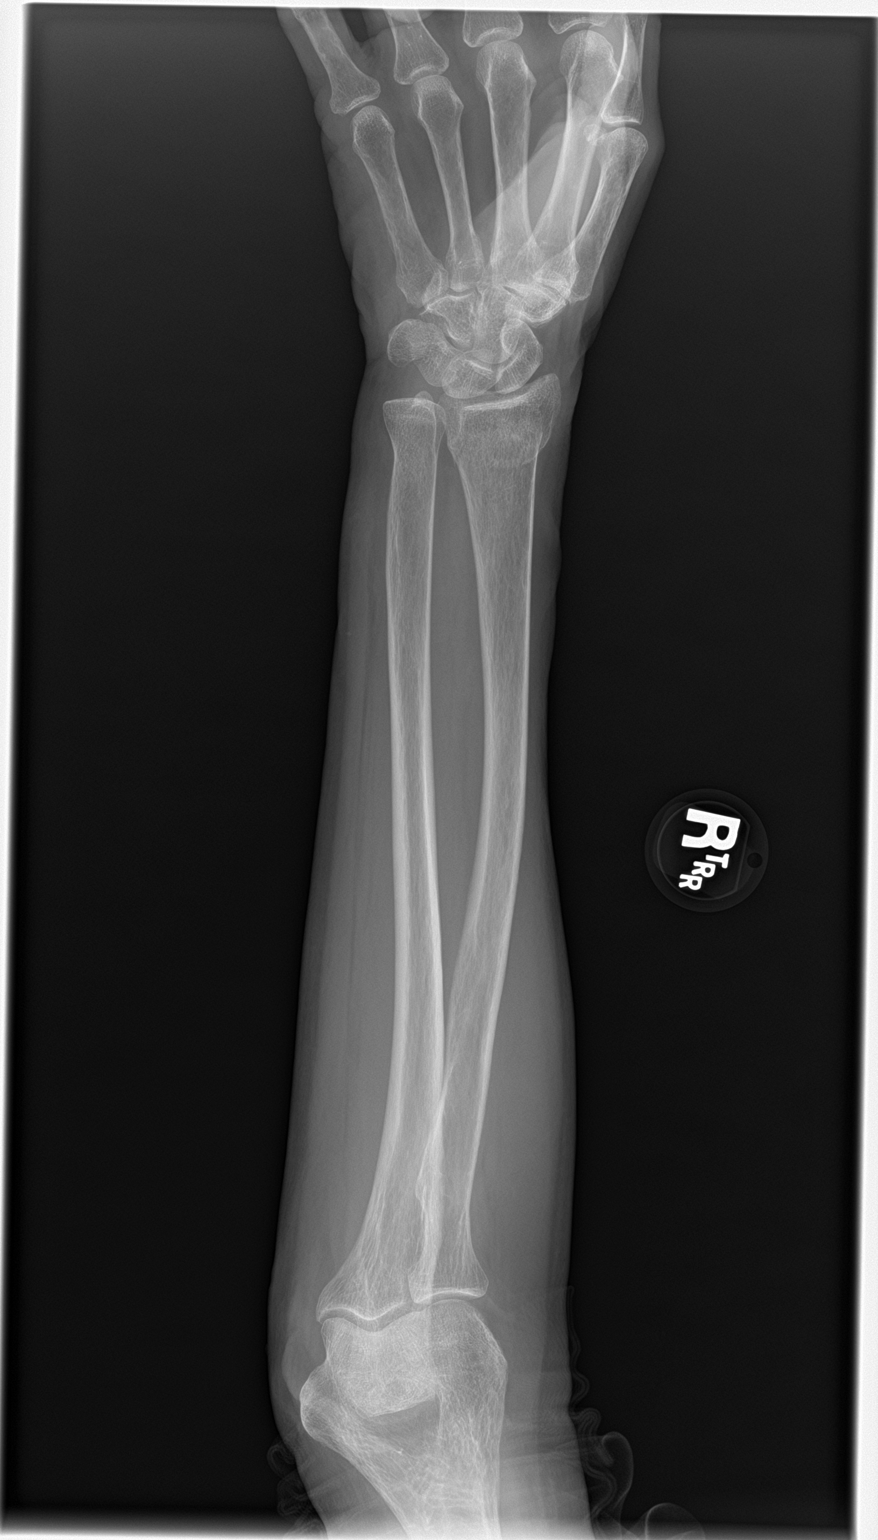
[im 2/2]
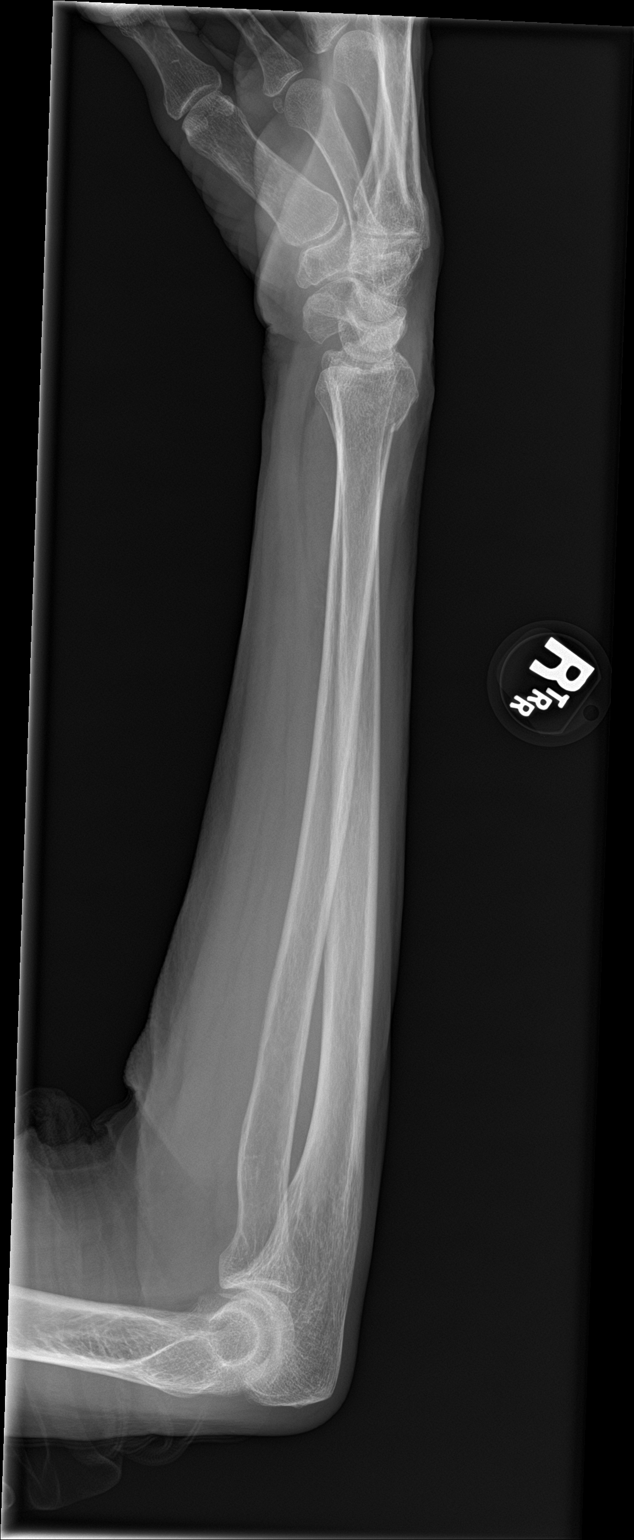

[2 of 2 positions shown; findings below may reference images not displayed]

FINDINGS: There is a fracture of the distal right radius with minimal dorsal
displacement. Minimal soft tissue swelling. No dislocation.
IMPRESSION: Minimally displaced fracture of the distal right radius.
